# Patient Record
Sex: Female | Born: 1950 | Race: White | Hispanic: No | Marital: Married | Smoking: Never smoker
Health system: Southern US, Community
[De-identification: ages and names within clinical notes are randomized; demographics above are authoritative.]

## PROBLEM LIST (undated history)

## (undated) DIAGNOSIS — Z8679 Personal history of other diseases of the circulatory system: Secondary | ICD-10-CM

## (undated) DIAGNOSIS — C9000 Multiple myeloma not having achieved remission: Secondary | ICD-10-CM

## (undated) DIAGNOSIS — Z9889 Other specified postprocedural states: Secondary | ICD-10-CM

## (undated) DIAGNOSIS — I2510 Atherosclerotic heart disease of native coronary artery without angina pectoris: Secondary | ICD-10-CM

## (undated) HISTORY — DX: Multiple myeloma not having achieved remission: C90.00

## (undated) HISTORY — PX: BONE MARROW TRANSPLANT: SHX200

---

## 1997-07-05 ENCOUNTER — Other Ambulatory Visit: Admission: RE | Admit: 1997-07-05 | Discharge: 1997-07-05 | Payer: Self-pay | Admitting: Family Medicine

## 1998-09-24 ENCOUNTER — Other Ambulatory Visit: Admission: RE | Admit: 1998-09-24 | Discharge: 1998-09-24 | Payer: Self-pay | Admitting: Family Medicine

## 2000-03-24 ENCOUNTER — Other Ambulatory Visit: Admission: RE | Admit: 2000-03-24 | Discharge: 2000-03-24 | Payer: Self-pay | Admitting: Family Medicine

## 2001-08-19 ENCOUNTER — Other Ambulatory Visit: Admission: RE | Admit: 2001-08-19 | Discharge: 2001-08-19 | Payer: Self-pay | Admitting: Family Medicine

## 2002-08-21 ENCOUNTER — Other Ambulatory Visit: Admission: RE | Admit: 2002-08-21 | Discharge: 2002-08-21 | Payer: Self-pay | Admitting: Family Medicine

## 2003-08-24 ENCOUNTER — Other Ambulatory Visit: Admission: RE | Admit: 2003-08-24 | Discharge: 2003-08-24 | Payer: Self-pay | Admitting: Family Medicine

## 2004-01-22 ENCOUNTER — Ambulatory Visit: Payer: Self-pay | Admitting: Family Medicine

## 2004-05-09 ENCOUNTER — Ambulatory Visit: Payer: Self-pay | Admitting: Family Medicine

## 2004-08-28 ENCOUNTER — Ambulatory Visit: Payer: Self-pay | Admitting: Family Medicine

## 2004-08-28 ENCOUNTER — Other Ambulatory Visit: Admission: RE | Admit: 2004-08-28 | Discharge: 2004-08-28 | Payer: Self-pay | Admitting: Family Medicine

## 2005-02-26 ENCOUNTER — Ambulatory Visit: Payer: Self-pay | Admitting: Family Medicine

## 2013-10-13 ENCOUNTER — Encounter (HOSPITAL_COMMUNITY): Payer: Self-pay | Admitting: Emergency Medicine

## 2013-10-13 ENCOUNTER — Emergency Department (HOSPITAL_COMMUNITY): Payer: BC Managed Care – PPO

## 2013-10-13 ENCOUNTER — Emergency Department (HOSPITAL_COMMUNITY)
Admission: EM | Admit: 2013-10-13 | Discharge: 2013-10-13 | Disposition: A | Discharge status: Home / Self Care | Payer: BC Managed Care – PPO | Attending: Emergency Medicine | Admitting: Emergency Medicine

## 2013-10-13 DIAGNOSIS — IMO0002 Reserved for concepts with insufficient information to code with codable children: Secondary | ICD-10-CM | POA: Insufficient documentation

## 2013-10-13 DIAGNOSIS — M79605 Pain in left leg: Secondary | ICD-10-CM

## 2013-10-13 DIAGNOSIS — I2510 Atherosclerotic heart disease of native coronary artery without angina pectoris: Secondary | ICD-10-CM | POA: Insufficient documentation

## 2013-10-13 DIAGNOSIS — Z9861 Coronary angioplasty status: Secondary | ICD-10-CM | POA: Insufficient documentation

## 2013-10-13 DIAGNOSIS — M79609 Pain in unspecified limb: Secondary | ICD-10-CM

## 2013-10-13 HISTORY — DX: Personal history of other diseases of the circulatory system: Z86.79

## 2013-10-13 HISTORY — DX: Atherosclerotic heart disease of native coronary artery without angina pectoris: I25.10

## 2013-10-13 HISTORY — DX: Other specified postprocedural states: Z98.890

## 2013-10-13 MED ORDER — IBUPROFEN 600 MG PO TABS: 600.0000 mg | ORAL_TABLET | Freq: Three times a day (TID) | ORAL | Status: DC | PRN

## 2013-10-13 MED ORDER — IBUPROFEN 400 MG PO TABS: 600.0000 mg | ORAL_TABLET | Freq: Once | ORAL | Status: DC

## 2013-10-13 MED ORDER — KETOROLAC TROMETHAMINE 60 MG/2ML IM SOLN
60.0000 mg | Freq: Once | INTRAMUSCULAR | Status: AC
  Administered 2013-10-13: 60 mg via INTRAMUSCULAR
  Filled 2013-10-13: qty 2

## 2013-10-13 MED ORDER — OXYCODONE-ACETAMINOPHEN 5-325 MG PO TABS
2.0000 | ORAL_TABLET | Freq: Once | ORAL | Status: AC
  Administered 2013-10-13: 2 via ORAL
  Filled 2013-10-13: qty 2

## 2013-10-13 NOTE — ED Provider Notes (Signed)
CSN: [id number]     Arrival date & time 10/13/13  1516 History   First MD Initiated Contact with Patient 10/13/13 1623     Chief Complaint  Patient presents with  . Leg Pain     HPI Patient presents to the emergency department with some worsening left thigh pain without radiation down towards her left lower leg.  She reports it is painful to ambulate on that left leg.  She states putting pressure on that left leg hurts.  She had an MRI scan of her lumbar spine done on August 1 which reportedly demonstrated lumbar foraminal stenosis and a concerning left S1 segment enhancing lesion concerning for metastatic disease versus myeloma.  Patient reports that she had a bone scan done several days ago that was reported to her as normal.  She reports ongoing discomfort at this time despite 50 mg of oxycodone.  No fevers or chills.  No history of arterial disease.  Denies bowel or bladder complaints.  No history of cancer.  No urinary complaints.   Past Medical History  Diagnosis Date  . S/P ablation operation for arrhythmia   . Coronary artery disease    History reviewed. No pertinent past surgical history. No family history on file. History  Substance Use Topics  . Smoking status: Never Smoker   . Smokeless tobacco: Not on file  . Alcohol Use: No   OB History   Grav Para Term Preterm Abortions TAB SAB Ect Mult Living                 Review of Systems  All other systems reviewed and are negative.     Allergies  Augmentin; Ciprofloxacin; Macrobid; and Sulfa antibiotics  Home Medications   Prior to Admission medications   Medication Sig Start Date End Date Taking? Authorizing Provider  aspirin 81 MG tablet Take 81 mg by mouth every morning.    Yes Historical Provider, MD  bismuth subsalicylate (PEPTO BISMOL) 262 MG/15ML suspension Take 30 mLs by mouth every 6 (six) hours as needed for indigestion.   Yes Historical Provider, MD  Cholecalciferol 1000 UNITS tablet Take 1,000 Units by  mouth every morning.    Yes Historical Provider, MD  diazepam (VALIUM) 5 MG tablet Take 5 mg by mouth every 6 (six) hours as needed for anxiety.   Yes Historical Provider, MD  Fenofibrate (LIPOFEN) 150 MG CAPS Take 150 mg by mouth every morning.    Yes Historical Provider, MD  finasteride (PROSCAR) 5 MG tablet Take 2.5 mg by mouth every morning.    Yes Historical Provider, MD  ibuprofen (ADVIL,MOTRIN) 200 MG tablet Take 400 mg by mouth every 6 (six) hours as needed for moderate pain.   Yes Historical Provider, MD  oxyCODONE (ROXICODONE) 15 MG immediate release tablet Take 15 mg by mouth every 6 (six) hours as needed for pain.   Yes Historical Provider, MD  pravastatin (PRAVACHOL) 80 MG tablet Take 80 mg by mouth at bedtime.    Yes Historical Provider, MD  predniSONE (DELTASONE) 20 MG tablet Take 40 mg by mouth daily with breakfast.   Yes Historical Provider, MD  verapamil (VERELAN PM) 120 MG 24 hr capsule Take 120 mg by mouth every morning.    Yes Historical Provider, MD  ibuprofen (ADVIL,MOTRIN) 600 MG tablet Take 1 tablet (600 mg total) by mouth every 8 (eight) hours as needed. 10/13/13   Hoy Morn, MD   BP 113/66  Pulse 60  Temp(Src) 97.5 F (36.4 C) (  Oral)  Resp 12  SpO2 96% Physical Exam  Nursing note and vitals reviewed. Constitutional: She is oriented to person, place, and time. She appears well-developed and well-nourished. No distress.  HENT:  Head: Normocephalic and atraumatic.  Eyes: EOM are normal.  Neck: Normal range of motion.  Cardiovascular: Normal rate, regular rhythm and normal heart sounds.   Pulmonary/Chest: Effort normal and breath sounds normal.  Abdominal: Soft. She exhibits no distension. There is no tenderness.  Musculoskeletal: Normal range of motion.  Full range of motion bilateral hips knees and ankles.  No swelling or skin changes of her left lower extremity.  Dopplerable PT pulses bilaterally.  Dopplerable DP pulse on the right.  No dopplerable DP pulse on  the left.  Perfusion to her feet.  Neurological: She is alert and oriented to person, place, and time.  Skin: Skin is warm and dry.  Psychiatric: She has a normal mood and affect. Judgment normal.    ED Course  Procedures (including critical care time) Labs Review Labs Reviewed - No data to display  Imaging Review Dg Tibia/fibula Left  10/13/2013   CLINICAL DATA:  Lateral left leg pain since October 05, 2013, no bruising or swelling  EXAM: LEFT TIBIA AND FIBULA - 2 VIEW  COMPARISON:  None.  FINDINGS: There is no evidence of fracture or other focal bone lesions. Soft tissues are unremarkable.  IMPRESSION: Negative.   Electronically Signed   By: Skipper Cliche M.D.   On: 10/13/2013 18:39  I personally reviewed the imaging tests through PACS system I reviewed available ER/hospitalization records through the EMR    EKG Interpretation None      MDM   Final diagnoses:  Left leg pain    Patient's symptoms seem to be more sciatica related.  I will not pursue this S1 hyperintense lesion any further tonight.  She can followup with her primary care physician regarding this.  She feels much better after Toradol and 2 Percocets in emergency apartment.  She can ambulate without any difficulty this time.  She states significant improvement in her left lateral leg pain.  There is no dopplerable DP pulse the left foot however I feel like this is more chronic finding.  She has faint DP pulse on the right.  I do not believe this to be the presentation of an ischemic extremity and do not think that she needs acute intervention regarding this.  Her pain is all started from her left buttock and left thigh region and now is radiating down.  This is more of her presentation sciatica with resolution of her symptoms in the emergency department with pain medicine.    Hoy Morn, MD 10/13/13 [facility]

## 2013-10-13 NOTE — Progress Notes (Signed)
*  Preliminary Results* Left lower extremity venous duplex completed. Left lower extremity is negative for deep vein thrombosis. There is no evidence of left Baker's cyst.  10/13/2013 6:12 PM  Maudry Mayhew, RVT, RDCS, RDMS

## 2013-10-13 NOTE — ED Notes (Signed)
The pt has had lt hip pain  For one week.  The pain goes from her hip  Down her entire lt leg and into her foot.  She was seen i n the ed in [place] Saturday and had a mri that   Showed a lesion in her spin e.  Monday she saw her regular doctor and she had a bone scan  Yesterday. They were called today and was told she needed to come to the ed and see a Chief of Staff.  C/o pain

## 2014-10-12 DIAGNOSIS — C9030 Solitary plasmacytoma not having achieved remission: Secondary | ICD-10-CM | POA: Insufficient documentation

## 2015-01-06 IMAGING — CR DG TIBIA/FIBULA 2V*L*
4 series · 4 of 4 positions shown · non-contrast
Comparison: None.

CLINICAL DATA: Lateral left leg pain since October 05, 2013, no
bruising or swelling

EXAM:
LEFT TIBIA AND FIBULA - 2 VIEW

[t tib/fib lat left (1 of 2)]
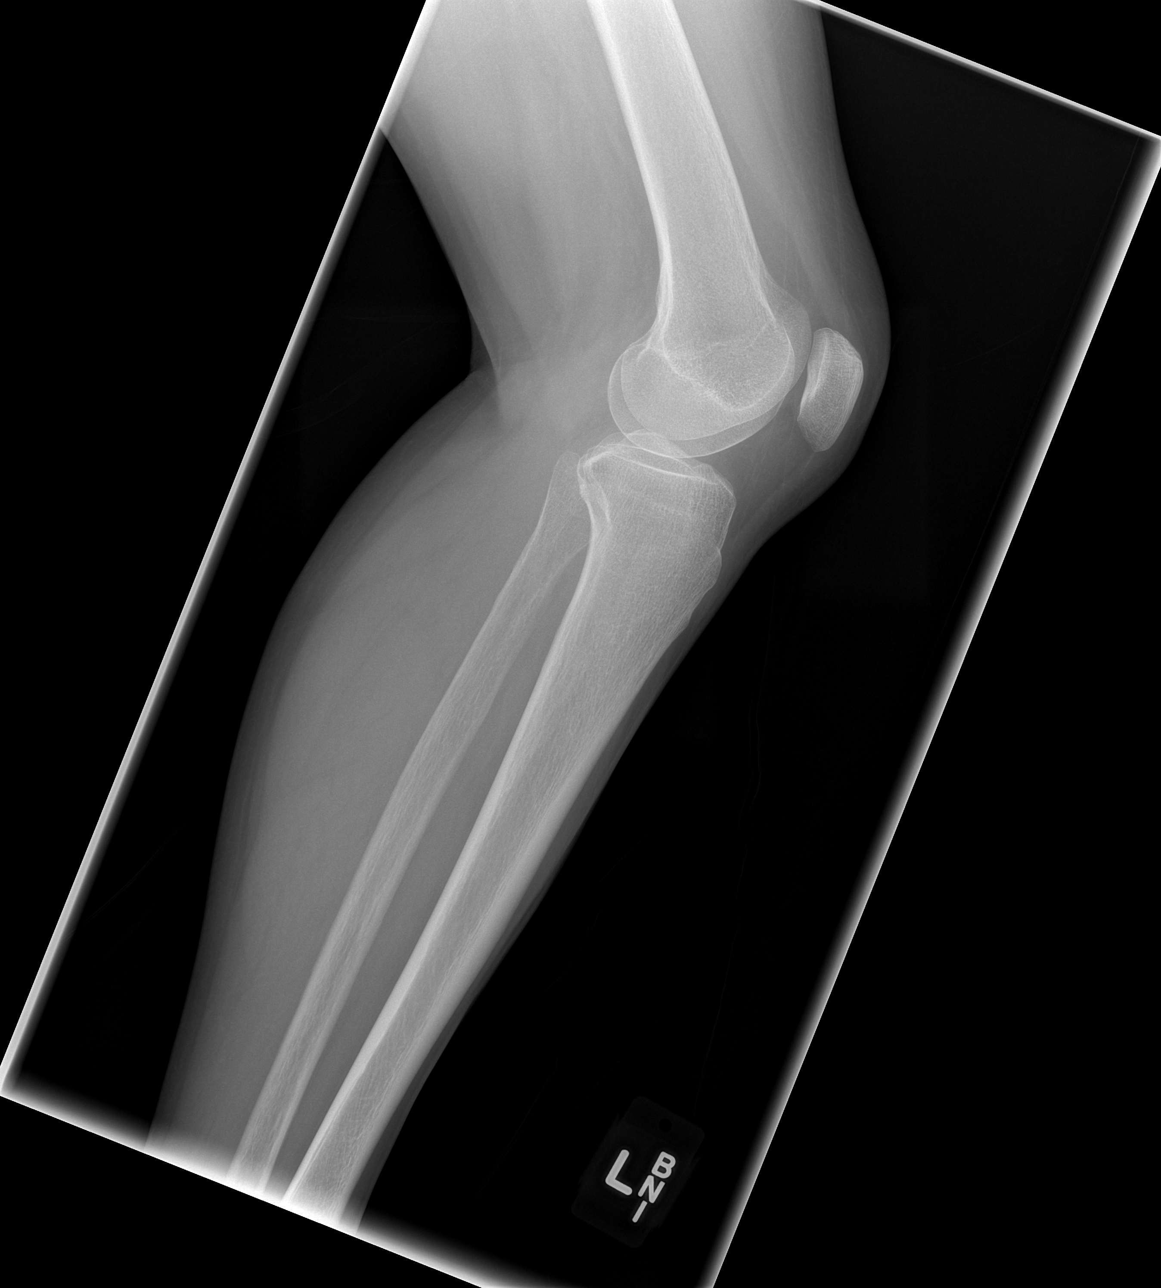

[t tib/fib lat left (2 of 2)]
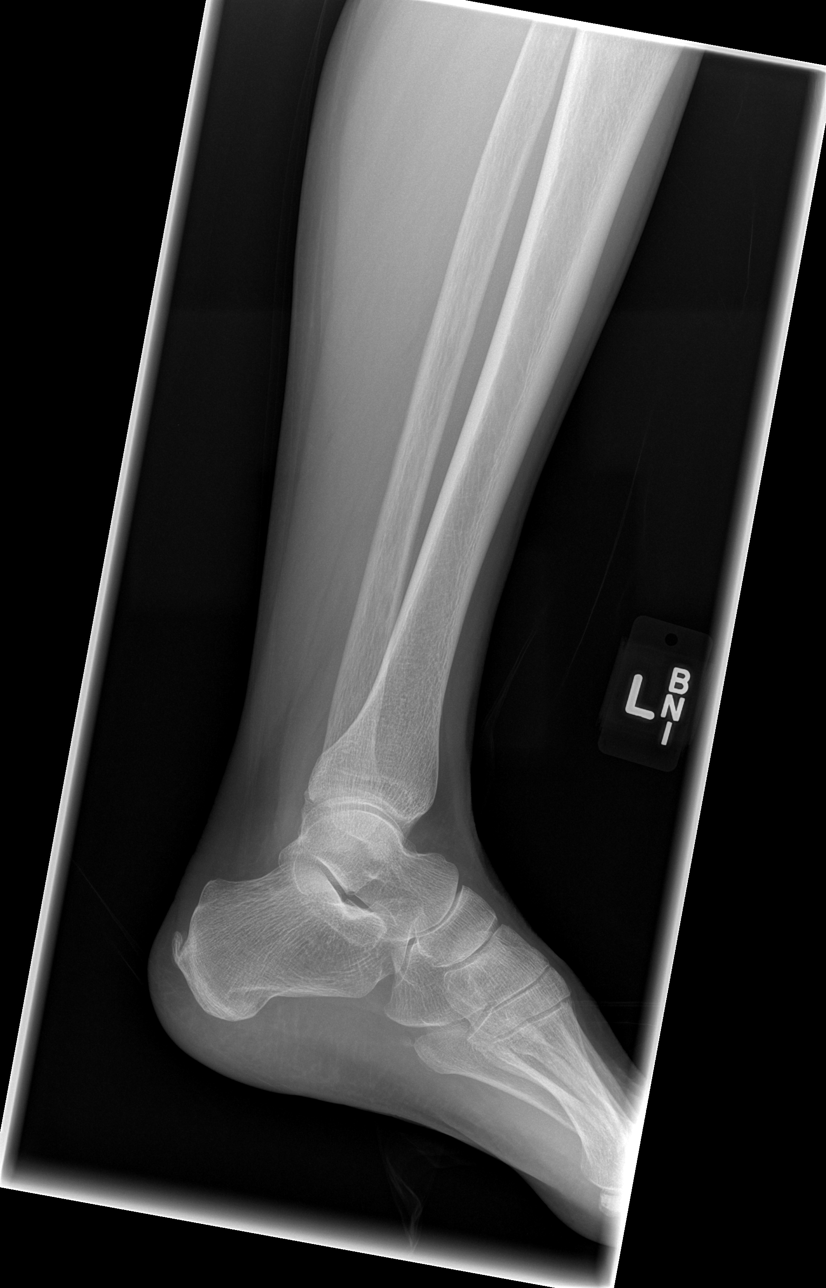

[t tib/fib ap left (1 of 2)]
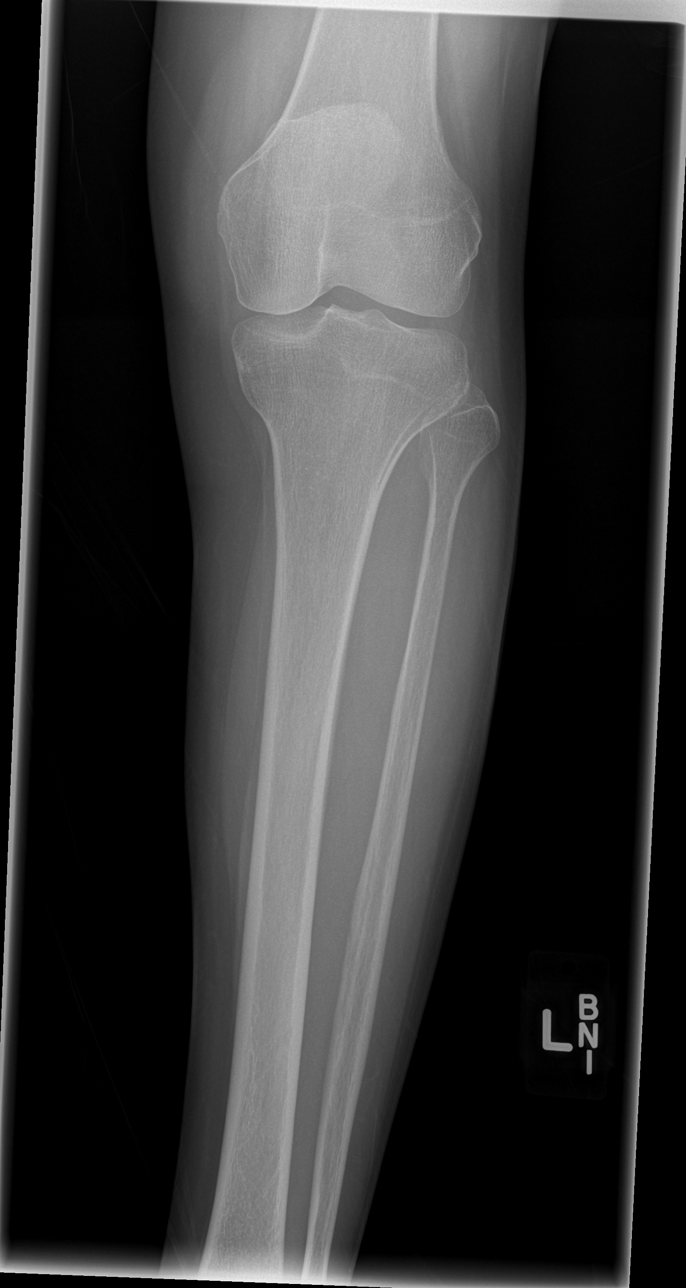

[t tib/fib ap left (2 of 2)]
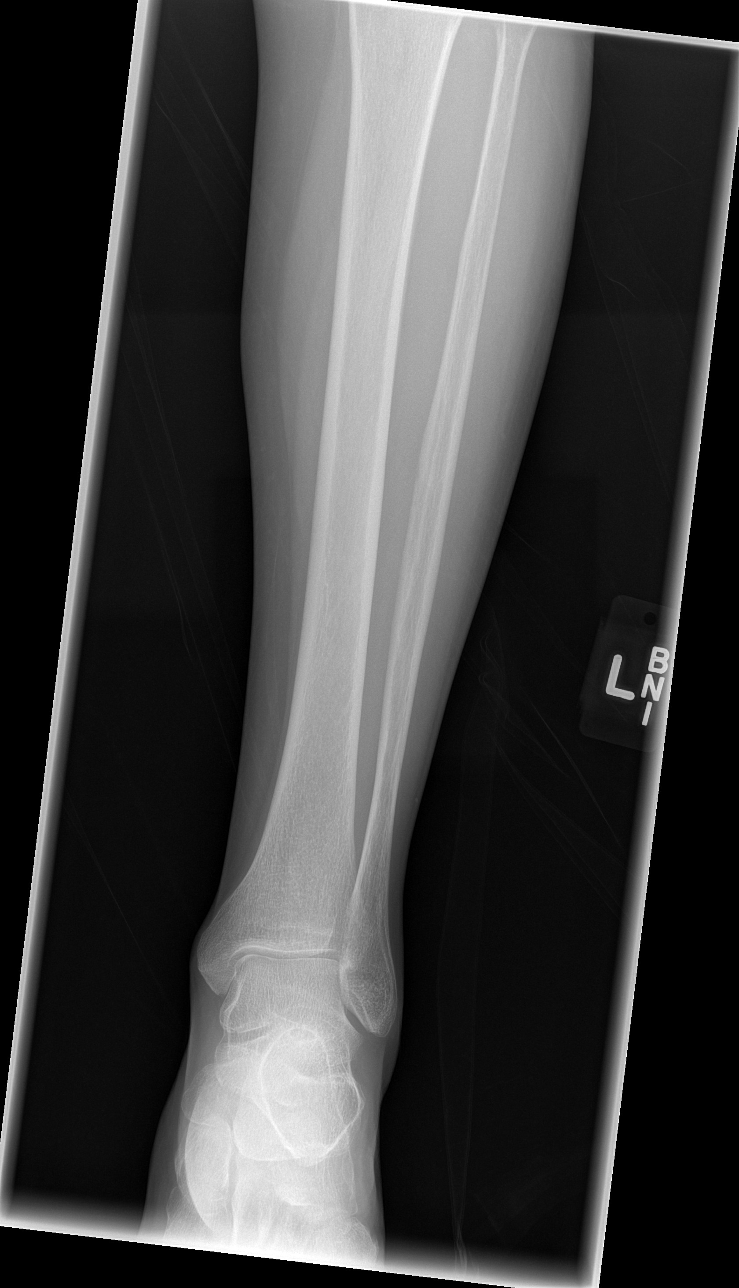

[4 of 4 positions shown; findings below may reference images not displayed]

FINDINGS: There is no evidence of fracture or other focal bone lesions. Soft
tissues are unremarkable.
IMPRESSION: Negative.

## 2015-05-01 DIAGNOSIS — C9001 Multiple myeloma in remission: Secondary | ICD-10-CM | POA: Insufficient documentation

## 2015-05-13 DIAGNOSIS — R9431 Abnormal electrocardiogram [ECG] [EKG]: Secondary | ICD-10-CM | POA: Insufficient documentation

## 2015-07-01 DIAGNOSIS — R7303 Prediabetes: Secondary | ICD-10-CM | POA: Insufficient documentation

## 2015-07-01 DIAGNOSIS — I471 Supraventricular tachycardia: Secondary | ICD-10-CM | POA: Insufficient documentation

## 2015-07-01 DIAGNOSIS — E782 Mixed hyperlipidemia: Secondary | ICD-10-CM | POA: Insufficient documentation

## 2015-07-01 DIAGNOSIS — Z131 Encounter for screening for diabetes mellitus: Secondary | ICD-10-CM | POA: Insufficient documentation

## 2015-07-01 DIAGNOSIS — F3340 Major depressive disorder, recurrent, in remission, unspecified: Secondary | ICD-10-CM | POA: Insufficient documentation

## 2015-07-23 DIAGNOSIS — F419 Anxiety disorder, unspecified: Secondary | ICD-10-CM | POA: Insufficient documentation

## 2015-07-23 DIAGNOSIS — M8580 Other specified disorders of bone density and structure, unspecified site: Secondary | ICD-10-CM | POA: Insufficient documentation

## 2015-09-17 DIAGNOSIS — Z9484 Stem cells transplant status: Secondary | ICD-10-CM | POA: Insufficient documentation

## 2015-11-13 DIAGNOSIS — T451X5A Adverse effect of antineoplastic and immunosuppressive drugs, initial encounter: Secondary | ICD-10-CM | POA: Insufficient documentation

## 2016-05-07 DIAGNOSIS — R928 Other abnormal and inconclusive findings on diagnostic imaging of breast: Secondary | ICD-10-CM | POA: Insufficient documentation

## 2017-01-22 DIAGNOSIS — M549 Dorsalgia, unspecified: Secondary | ICD-10-CM | POA: Insufficient documentation

## 2017-01-22 DIAGNOSIS — G8929 Other chronic pain: Secondary | ICD-10-CM | POA: Insufficient documentation

## 2017-01-22 DIAGNOSIS — Z8619 Personal history of other infectious and parasitic diseases: Secondary | ICD-10-CM | POA: Insufficient documentation

## 2017-10-06 DIAGNOSIS — K921 Melena: Secondary | ICD-10-CM | POA: Insufficient documentation

## 2018-03-24 DIAGNOSIS — H9193 Unspecified hearing loss, bilateral: Secondary | ICD-10-CM | POA: Insufficient documentation

## 2018-04-22 DIAGNOSIS — K922 Gastrointestinal hemorrhage, unspecified: Secondary | ICD-10-CM | POA: Insufficient documentation

## 2018-08-26 DIAGNOSIS — G4709 Other insomnia: Secondary | ICD-10-CM | POA: Insufficient documentation

## 2018-11-21 DIAGNOSIS — N390 Urinary tract infection, site not specified: Secondary | ICD-10-CM | POA: Insufficient documentation

## 2018-12-07 ENCOUNTER — Other Ambulatory Visit: Payer: Self-pay | Admitting: Sports Medicine

## 2018-12-07 ENCOUNTER — Encounter: Payer: Self-pay | Admitting: Sports Medicine

## 2018-12-07 ENCOUNTER — Ambulatory Visit (INDEPENDENT_AMBULATORY_CARE_PROVIDER_SITE_OTHER): Payer: Medicare Other

## 2018-12-07 ENCOUNTER — Other Ambulatory Visit: Payer: Self-pay

## 2018-12-07 ENCOUNTER — Ambulatory Visit (INDEPENDENT_AMBULATORY_CARE_PROVIDER_SITE_OTHER): Payer: Medicare Other | Admitting: Sports Medicine

## 2018-12-07 DIAGNOSIS — M79672 Pain in left foot: Secondary | ICD-10-CM | POA: Diagnosis not present

## 2018-12-07 DIAGNOSIS — M79671 Pain in right foot: Secondary | ICD-10-CM

## 2018-12-07 DIAGNOSIS — M79673 Pain in unspecified foot: Secondary | ICD-10-CM

## 2018-12-07 DIAGNOSIS — M722 Plantar fascial fibromatosis: Secondary | ICD-10-CM | POA: Diagnosis not present

## 2018-12-07 DIAGNOSIS — G620 Drug-induced polyneuropathy: Secondary | ICD-10-CM | POA: Diagnosis not present

## 2018-12-07 DIAGNOSIS — T451X5A Adverse effect of antineoplastic and immunosuppressive drugs, initial encounter: Secondary | ICD-10-CM

## 2018-12-07 NOTE — Patient Instructions (Signed)

## 2018-12-07 NOTE — Progress Notes (Signed)
Subjective: Heather Meyers is a 68 y.o. female patient presents to office with complaint of moderate heel pain on the left = right. Patient admits to post static dyskinesia for 2 months reports that pain is 8 out of 10 achy in nature worse with walking or when she goes from sitting to standing reports that the pain is so bad sometimes that she hobbles first thing in the morning.  Patient has not tried any specific treatment but did try changing her shoes and does notice a little bit of a difference when she has on shoes versus walking barefoot.  Patient denies any change in activity or any acute injury or trauma.  Patient is and chemo and is in remission with history of multiple myeloma.  Patient admits to history of tingling to toes since starting chemo and knows that she has neuropathy currently on Lyrica.  Denies any other pedal complaints.   There are no active problems to display for this patient.   Current Outpatient Medications on File Prior to Visit  Medication Sig Dispense Refill  . ondansetron (ZOFRAN-ODT) 8 MG disintegrating tablet as needed.    Marland Kitchen aspirin 81 MG tablet Take 81 mg by mouth every morning.      No current facility-administered medications on file prior to visit.     Allergies  Allergen Reactions  . Cefdinir Other (See Comments)    Other reaction(s): Bleeding (intolerance) Hemorrhoid bleeding Hemorrhoid bleeding   . Cephalexin Hives and Other (See Comments)    Has taken without problem   . Augmentin [Amoxicillin-Pot Clavulanate] Nausea Only  . Ciprofloxacin Nausea Only  . Macrobid [Nitrofurantoin Monohyd Macro] Nausea Only  . Sulfa Antibiotics Hives    Objective: Physical Exam General: The patient is alert and oriented x3 in no acute distress.  Dermatology: Skin is warm, dry and supple bilateral lower extremities. Nails 1-10 are normal. There is no erythema, edema, no eccymosis, no open lesions present. Integument is otherwise unremarkable.  Vascular:  Dorsalis Pedis pulse and Posterior Tibial pulse are 1/4 bilateral. Capillary fill time is immediate to all digits.  Neurological: Grossly intact to light touch with an achilles reflex of +2/5 and a  negative Tinel's sign bilateral.  Musculoskeletal: Tenderness to palpation at the medial calcaneal tubercale and through the insertion of the plantar fascia on the left=right foot. No pain with compression of calcaneus bilateral. No pain with tuning fork to calcaneus bilateral. No pain with calf compression bilateral. There is decreased Ankle joint range of motion bilateral. All other joints range of motion within normal limits bilateral. Strength 5/5 in all groups bilateral.   Gait: Unassisted, Antalgic avoid weight on heels  Xray, Right/Left foot:  Normal osseous mineralization. Joint spaces preserved. No fracture/dislocation/boney destruction. Calcaneal spur present with mild thickening of plantar fascia. No other soft tissue abnormalities or radiopaque foreign bodies.   Assessment and Plan: Problem List Items Addressed This Visit    None    Visit Diagnoses    Plantar fasciitis, bilateral    -  Primary   Inflammatory heel pain, unspecified laterality       Chemotherapy-induced neuropathy ([place])           -Complete examination performed.  -Xrays reviewed -Discussed with patient in detail the condition of plantar fasciitis, how this occurs and general treatment options. Explained both conservative and surgical treatments.  -Did not start patient on oral prednisone or give a Kenalog shot at this time patient prefers for me to speak with her oncologist Dr.  Norma Fredrickson before we consider any type of medication treatment for her inflamed heels. -Dispensed heel cushions for patient to use in shoes and advised patient to avoid from walking barefoot. -Explained and dispensed to patient daily stretching exercises. -Recommend patient to ice affected area 1-2x daily. -Patient to return to office as  scheduled or sooner if problems or questions arise.  Landis Martins, DPM

## 2018-12-29 ENCOUNTER — Telehealth: Payer: Self-pay | Admitting: Urology

## 2018-12-29 NOTE — Telephone Encounter (Signed)
Pt. Called to make appointment to see you again. Pt saw you on 9/30 for PF and said she is still having pain and wanted to discuss meds with you but pt is a cancer pt and wants you to call her Doc. At [place] would like information to here for next appointment on 01/05/2019 at 10:45.

## 2018-12-29 NOTE — Telephone Encounter (Signed)
I have called and sent note to her oncologist. We are waiting to hear back from him. -Dr. Cannon Kettle

## 2019-01-05 ENCOUNTER — Encounter: Payer: Self-pay | Admitting: Sports Medicine

## 2019-01-05 ENCOUNTER — Ambulatory Visit (INDEPENDENT_AMBULATORY_CARE_PROVIDER_SITE_OTHER): Payer: Medicare Other | Admitting: Sports Medicine

## 2019-01-05 ENCOUNTER — Other Ambulatory Visit: Payer: Self-pay

## 2019-01-05 DIAGNOSIS — T451X5A Adverse effect of antineoplastic and immunosuppressive drugs, initial encounter: Secondary | ICD-10-CM

## 2019-01-05 DIAGNOSIS — M722 Plantar fascial fibromatosis: Secondary | ICD-10-CM

## 2019-01-05 DIAGNOSIS — G620 Drug-induced polyneuropathy: Secondary | ICD-10-CM

## 2019-01-05 DIAGNOSIS — M79673 Pain in unspecified foot: Secondary | ICD-10-CM

## 2019-01-05 MED ORDER — TRIAMCINOLONE ACETONIDE 10 MG/ML IJ SUSP
10.0000 mg | Freq: Once | INTRAMUSCULAR | Status: AC
  Administered 2019-01-05: 10 mg

## 2019-01-05 NOTE — Progress Notes (Signed)
Subjective: Heather Meyers is a 68 y.o. female patient presents to office with complaint of moderate heel pain on the left = right. Patient reports that the pain is about the same worse with major activities and first few steps out of bed reports that she likes wearing the heel cushions but still has pain only time that she has had relief is when she goes for chemo and they give her dexamethasone that seems to help for about 2 days and then the pain flares back up.  Patient denies any other pedal complaints.   There are no active problems to display for this patient.   Current Outpatient Medications on File Prior to Visit  Medication Sig Dispense Refill  . acyclovir (ZOVIRAX) 800 MG tablet Take 800 mg by mouth 2 (two) times daily.    Marland Kitchen aspirin 81 MG tablet Take 81 mg by mouth every morning.     Marland Kitchen atorvastatin (LIPITOR) 80 MG tablet TAKE ONE TABLET BY MOUTH ONCE DAILY FOR cholesterol    . glycopyrrolate (ROBINUL) 1 MG tablet     . omeprazole (PRILOSEC) 20 MG capsule Take 20 mg by mouth daily.    . ondansetron (ZOFRAN-ODT) 8 MG disintegrating tablet as needed.    . pregabalin (LYRICA) 50 MG capsule Take 50 mg by mouth 2 (two) times daily.    Marland Kitchen REVLIMID 5 MG capsule     . sertraline (ZOLOFT) 100 MG tablet TAKE ONE TABLET BY MOUTH ONCE DAILY FOR ANXIETY     No current facility-administered medications on file prior to visit.     Allergies  Allergen Reactions  . Cefdinir Other (See Comments)    Other reaction(s): Bleeding (intolerance) Hemorrhoid bleeding Hemorrhoid bleeding   . Cephalexin Hives and Other (See Comments)    Has taken without problem   . Augmentin [Amoxicillin-Pot Clavulanate] Nausea Only  . Ciprofloxacin Nausea Only  . Macrobid [Nitrofurantoin Monohyd Macro] Nausea Only  . Sulfa Antibiotics Hives    Objective: Physical Exam General: The patient is alert and oriented x3 in no acute distress.  Dermatology: Skin is warm, dry and supple bilateral lower extremities.  Nails 1-10 are normal. There is no erythema, edema, no eccymosis, no open lesions present. Integument is otherwise unremarkable.  Vascular: Dorsalis Pedis pulse and Posterior Tibial pulse are 1/4 bilateral. Capillary fill time is immediate to all digits.  Neurological: Grossly intact to light touch with an achilles reflex of +2/5 and a  negative Tinel's sign bilateral.  Musculoskeletal: Tenderness to palpation at the medial calcaneal tubercale and through the insertion of the plantar fascia on the left=right foot. No pain with compression of calcaneus bilateral. No pain with tuning fork to calcaneus bilateral. No pain with calf compression bilateral. There is decreased Ankle joint range of motion bilateral. All other joints range of motion within normal limits bilateral. Strength 5/5 in all groups bilateral.   Assessment and Plan: Problem List Items Addressed This Visit    None    Visit Diagnoses    Plantar fasciitis, bilateral    -  Primary   Inflammatory heel pain, unspecified laterality       Chemotherapy-induced neuropathy ([facility])         -Complete examination performed.  -Previous xrays reviewed -Re-Discussed with patient in detail the condition of plantar fasciitis, how this occurs and general treatment options. Explained both conservative and surgical treatments.  -After oral consent and aseptic prep, injected a mixture containing 1 ml of 2%  plain lidocaine, 1 ml 0.5% plain  marcaine, 0.5 ml of kenalog 10 and 0.5 ml of dexamethasone phosphate into into right and left heel at glabrous junction without complication. Post-injection care discussed with patient.  -Dispensed a new heel cushions for patient to use in shoes and advised patient to avoid from walking barefoot and may invest in getting over-the-counter silicone heel cushions as well. -Advised patient to continue with daily stretching exercises and advised icing -Patient to return to office as scheduled in 1 month or sooner if  problems or questions arise.  Landis Martins, DPM

## 2019-02-09 ENCOUNTER — Ambulatory Visit: Payer: Medicare Other | Admitting: Sports Medicine

## 2019-02-24 ENCOUNTER — Encounter: Payer: Self-pay | Admitting: Sports Medicine

## 2019-02-24 ENCOUNTER — Other Ambulatory Visit: Payer: Self-pay

## 2019-02-24 ENCOUNTER — Ambulatory Visit (INDEPENDENT_AMBULATORY_CARE_PROVIDER_SITE_OTHER): Payer: Medicare Other | Admitting: Sports Medicine

## 2019-02-24 DIAGNOSIS — M79673 Pain in unspecified foot: Secondary | ICD-10-CM | POA: Diagnosis not present

## 2019-02-24 DIAGNOSIS — G620 Drug-induced polyneuropathy: Secondary | ICD-10-CM

## 2019-02-24 DIAGNOSIS — M722 Plantar fascial fibromatosis: Secondary | ICD-10-CM | POA: Diagnosis not present

## 2019-02-24 DIAGNOSIS — T451X5A Adverse effect of antineoplastic and immunosuppressive drugs, initial encounter: Secondary | ICD-10-CM | POA: Diagnosis not present

## 2019-02-24 NOTE — Progress Notes (Signed)
Subjective: Heather Meyers is a 68 y.o. female returns to office for follow up evaluation after Left/Right heel injection for plantar fasciitis, injection #1 administered 8 weeks ago. Patient states that the injection seems to help her pain reports that she is doing very well with no reoccurrence in symptoms nothing hurts nothing swells and reports that her heels are doing well it is all better.  Patient denies any recent changes in medications or new problems since last visit.   There are no problems to display for this patient.   Current Outpatient Medications on File Prior to Visit  Medication Sig Dispense Refill  . baclofen (LIORESAL) 10 MG tablet Take by mouth.    . Cholecalciferol 25 MCG (1000 UT) tablet Take by mouth.    . cholestyramine (QUESTRAN) 4 g packet Take by mouth.    Marland Kitchen acyclovir (ZOVIRAX) 800 MG tablet Take 800 mg by mouth 2 (two) times daily.    Marland Kitchen aspirin 81 MG tablet Take 81 mg by mouth every morning.     Marland Kitchen atorvastatin (LIPITOR) 80 MG tablet TAKE ONE TABLET BY MOUTH ONCE DAILY FOR cholesterol    . glycopyrrolate (ROBINUL) 1 MG tablet     . omeprazole (PRILOSEC) 20 MG capsule Take 20 mg by mouth daily.    . ondansetron (ZOFRAN-ODT) 8 MG disintegrating tablet as needed.    . pregabalin (LYRICA) 50 MG capsule Take 50 mg by mouth 2 (two) times daily.    Marland Kitchen REVLIMID 5 MG capsule     . sertraline (ZOLOFT) 100 MG tablet TAKE ONE TABLET BY MOUTH ONCE DAILY FOR ANXIETY     No current facility-administered medications on file prior to visit.    Allergies  Allergen Reactions  . Cefdinir Other (See Comments)    Other reaction(s): Bleeding (intolerance) Hemorrhoid bleeding Hemorrhoid bleeding   . Cephalexin Hives and Other (See Comments)    Has taken without problem   . Trazodone Other (See Comments)    Blurred vision, nasal congestion  . Augmentin [Amoxicillin-Pot Clavulanate] Nausea Only  . Ciprofloxacin Nausea Only  . Macrobid [Nitrofurantoin Monohyd Macro] Nausea Only   . Sulfa Antibiotics Hives    Objective:   General:  Alert and oriented x 3, in no acute distress  Dermatology: Skin is warm, dry, and supple bilateral. Nails are within normal limits. There is no lower extremity erythema, no eccymosis, no open lesions present bilateral.   Vascular: Dorsalis Pedis and Posterior Tibial pedal pulses are 1/4 bilateral. + hair growth noted bilateral. Capillary Fill Time is 3 seconds in all digits.  Mild varicosities, No edema bilateral lower extremities.   Neurological: Sensation grossly intact to light touch with an achilles reflex of +2 and a  negative Tinel's sign bilateral. Vibratory, sharp/dull, Semmes Weinstein Monofilament within normal limits.   Musculoskeletal: There is no tenderness to palpation at the medial calcaneal tubercale and through the insertion of the plantar fascia on the Left or right foot. No pain with compression to calcaneus or application of tuning fork. There is decreased Ankle joint range of motion bilateral. All other joints range of motion  within normal limits bilateral. Strength 5/5 bilateral.   Assessment and Plan: Problem List Items Addressed This Visit    None    Visit Diagnoses    Plantar fasciitis, bilateral    -  Primary   Inflammatory heel pain, unspecified laterality       Chemotherapy-induced neuropathy ([facility])       Relevant Medications   baclofen (LIORESAL)  10 MG tablet      -Complete examination performed.  -Previous x-rays reviewed. -Discussed long-term care for plantar fasciitis -Advised patient that she is doing well no reinjection is needed at this time -Continue with stretching, icing, good supportive shoes, inserts daily.  Recommend air plus insoles from [facility] or [facility]. -Discussed long term care and reocurrence; will closely monitor; if fails to improve will consider other treatment modalities.  -Patient to return to office as needed or sooner if problems or questions arise.  Landis Martins,  DPM

## 2019-05-08 DIAGNOSIS — J309 Allergic rhinitis, unspecified: Secondary | ICD-10-CM | POA: Insufficient documentation

## 2020-02-13 ENCOUNTER — Ambulatory Visit (INDEPENDENT_AMBULATORY_CARE_PROVIDER_SITE_OTHER): Payer: Medicare Other | Admitting: Sports Medicine

## 2020-02-13 ENCOUNTER — Other Ambulatory Visit: Payer: Self-pay

## 2020-02-13 ENCOUNTER — Encounter: Payer: Self-pay | Admitting: Sports Medicine

## 2020-02-13 DIAGNOSIS — T451X5A Adverse effect of antineoplastic and immunosuppressive drugs, initial encounter: Secondary | ICD-10-CM

## 2020-02-13 DIAGNOSIS — G620 Drug-induced polyneuropathy: Secondary | ICD-10-CM

## 2020-02-13 DIAGNOSIS — Z9221 Personal history of antineoplastic chemotherapy: Secondary | ICD-10-CM | POA: Insufficient documentation

## 2020-02-13 DIAGNOSIS — M722 Plantar fascial fibromatosis: Secondary | ICD-10-CM

## 2020-02-13 DIAGNOSIS — M79673 Pain in unspecified foot: Secondary | ICD-10-CM

## 2020-02-13 MED ORDER — TRIAMCINOLONE ACETONIDE 10 MG/ML IJ SUSP
10.0000 mg | Freq: Once | INTRAMUSCULAR | Status: AC
  Administered 2020-02-13: 10 mg

## 2020-02-13 NOTE — Progress Notes (Signed)
Subjective: Heather Meyers is a 69 y.o. female patient returns to office with complaint of moderate heel pain on the right greater than left. Patient reports that her pads in her shoes seem to help however wants to have another shot of medicine since that also helped in the past as well states that she has been dealing with the pain for roughly about 2 to 3 months but slowly got worse.  Patient denies any other pedal complaints.   Patient Active Problem List   Diagnosis Date Noted  . History of chemotherapy 02/13/2020  . Allergic rhinitis 05/08/2019  . Urinary tract infection without hematuria 11/21/2018  . Other insomnia 08/26/2018  . Gastrointestinal bleeding, lower 04/22/2018  . Decreased hearing of both ears 03/24/2018  . Hematochezia 10/06/2017  . Chronic back pain 01/22/2017  . H/O measles 01/22/2017  . Mammogram abnormal 05/07/2016  . 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 H/O autologous stem cell transplant ([place]) 09/17/2015  . Anxiety 07/23/2015  . Osteopenia 07/23/2015  . Screening for diabetes mellitus (DM) 07/01/2015  . Hyperlipidemia, mixed 07/01/2015  . Prediabetes 07/01/2015  . Recurrent major depressive disorder, in remission ([place]) 07/01/2015  . SVT (supraventricular tachycardia) ([place]) 07/01/2015  . Abnormal resting ECG findings 05/13/2015  . Multiple myeloma in remission ([place]) 05/01/2015  . 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 Aspirin Buf,CaCarb-MgCarb-MgO, 81 MG TABS Take by mouth.    . cyanocobalamin 1000 MCG tablet Take by mouth.    Marland Kitchen DARATUMUMAB IV     . ergocalciferol (VITAMIN D2) 1.25 MG (50000 UT) capsule Take by mouth.    . estradiol (ESTRACE) 0.1 MG/GM vaginal cream Place vaginally.    . ondansetron (ZOFRAN) 8 MG tablet Take by mouth.    Marland Kitchen acyclovir (ZOVIRAX) 800 MG tablet Take 800 mg by mouth 2 (two) times daily.    Marland Kitchen aspirin 81 MG  tablet Take 81 mg by mouth every morning.     Marland Kitchen atorvastatin (LIPITOR) 80 MG tablet TAKE ONE TABLET BY MOUTH ONCE DAILY FOR cholesterol    . baclofen (LIORESAL) 10 MG tablet Take by mouth.    . Cholecalciferol 25 MCG (1000 UT) tablet Take by mouth.    . cholestyramine (QUESTRAN) 4 g packet Take by mouth.    . doxycycline (VIBRAMYCIN) 100 MG capsule Take 100 mg by mouth 2 (two) times daily.    . fluticasone (FLONASE) 50 MCG/ACT nasal spray Place into the nose.    Marland Kitchen gentamicin (GARAMYCIN) 0.3 % ophthalmic solution Place 2 drops into the left eye 4 (four) times daily.    Marland Kitchen glycopyrrolate (ROBINUL) 1 MG tablet     . loratadine (CLARITIN) 10 MG tablet Take by mouth.    . nitrofurantoin (MACRODANTIN) 50 MG capsule Take by mouth.    Marland Kitchen omeprazole (PRILOSEC) 20 MG capsule Take 20 mg by mouth daily.    . ondansetron (ZOFRAN-ODT) 8 MG disintegrating tablet as needed.    . pregabalin (LYRICA) 50 MG capsule Take 50 mg by mouth 2 (two) times daily.    Marland Kitchen REVLIMID 5 MG capsule     . sertraline (ZOLOFT) 100 MG tablet TAKE ONE TABLET BY MOUTH ONCE DAILY FOR ANXIETY    . triamcinolone (KENALOG) 0.1 % SMARTSIG:1 Application Topical 2-3 Times Daily     No current facility-administered medications on file prior to visit.    Allergies  Allergen Reactions  . Cefdinir Other (  See Comments)    Other reaction(s): Bleeding (intolerance) Hemorrhoid bleeding Hemorrhoid bleeding   . Cephalexin Hives and Other (See Comments)    Has taken without problem   . Trazodone Other (See Comments)    Blurred vision, nasal congestion  . Augmentin [Amoxicillin-Pot Clavulanate] Nausea Only  . Ciprofloxacin Nausea Only  . Macrobid [Nitrofurantoin Monohyd Macro] Nausea Only  . Sulfa Antibiotics Hives    Objective: Physical Exam General: The patient is alert and oriented x3 in no acute distress.  Dermatology: Skin is warm, dry and supple bilateral lower extremities. Nails 1-10 are normal. There is no erythema, edema, no  eccymosis, no open lesions present. Integument is otherwise unremarkable.  Vascular: Dorsalis Pedis pulse and Posterior Tibial pulse are 1/4 bilateral. Capillary fill time is immediate to all digits.  Neurological: Grossly intact to light touch with an achilles reflex of +2/5 and a  negative Tinel's sign bilateral.  Musculoskeletal: Tenderness to palpation at the medial calcaneal tubercale and through the insertion of the plantar fascia on the right greater than left foot. No pain with compression of calcaneus bilateral. No pain with tuning fork to calcaneus bilateral. No pain with calf compression bilateral. There is decreased Ankle joint range of motion bilateral. All other joints range of motion within normal limits bilateral. Strength 5/5 in all groups bilateral.   Assessment and Plan: Problem List Items Addressed This Visit    None    Visit Diagnoses    Plantar fasciitis, bilateral    -  Primary   Inflammatory heel pain, unspecified laterality       Chemotherapy-induced neuropathy (HCC)         -Complete examination performed.  -Previous xrays reviewed -Re-Discussed with patient in detail the condition of plantar fasciitis, how this occurs and general treatment options. Explained both conservative and surgical treatments.  -After oral consent and aseptic prep, injected a mixture containing 1 ml of 2%  plain lidocaine, 1 ml 0.5% plain marcaine, 0.5 ml of kenalog 10 and 0.5 ml of dexamethasone phosphate into into right and left heel at glabrous junction without complication. Post-injection care discussed with patient.  -Patient became faint during this injection but did not pass out blood pressure was recorded and patient was thoroughly hydrated with water vital signs were noted to be stable patient was instructed if symptoms worsen to call office or to report to PCP or urgent care -Advised patient to continue with daily stretching exercises and advised icing -Patient to return to office  as needed or sooner if problems or questions arise.  Landis Martins, DPM

## 2020-05-21 ENCOUNTER — Ambulatory Visit: Payer: Medicare Other | Admitting: Sports Medicine

## 2020-05-22 ENCOUNTER — Other Ambulatory Visit: Payer: Self-pay

## 2020-05-22 ENCOUNTER — Encounter: Payer: Self-pay | Admitting: Sports Medicine

## 2020-05-22 ENCOUNTER — Ambulatory Visit (INDEPENDENT_AMBULATORY_CARE_PROVIDER_SITE_OTHER): Payer: Medicare Other | Admitting: Sports Medicine

## 2020-05-22 DIAGNOSIS — M722 Plantar fascial fibromatosis: Secondary | ICD-10-CM | POA: Diagnosis not present

## 2020-05-22 DIAGNOSIS — M79673 Pain in unspecified foot: Secondary | ICD-10-CM | POA: Diagnosis not present

## 2020-05-22 DIAGNOSIS — M216X9 Other acquired deformities of unspecified foot: Secondary | ICD-10-CM

## 2020-05-22 DIAGNOSIS — G620 Drug-induced polyneuropathy: Secondary | ICD-10-CM

## 2020-05-22 DIAGNOSIS — L603 Nail dystrophy: Secondary | ICD-10-CM

## 2020-05-22 DIAGNOSIS — T451X5A Adverse effect of antineoplastic and immunosuppressive drugs, initial encounter: Secondary | ICD-10-CM

## 2020-05-22 MED ORDER — TRIAMCINOLONE ACETONIDE 10 MG/ML IJ SUSP
10.0000 mg | Freq: Once | INTRAMUSCULAR | Status: AC
  Administered 2020-05-22: 10 mg

## 2020-05-22 MED ORDER — DICLOFENAC SODIUM 1 % EX GEL: 4.0000 g | Freq: Four times a day (QID) | CUTANEOUS | 0 refills | Status: DC

## 2020-05-22 NOTE — Progress Notes (Signed)
Subjective: Heather Meyers is a 70 y.o. female patient returns to office with complaint of heel pain again reports it was better for a little while now it is back but on the outside of the heel 9 out of 10 sharp pain.  Patient also reports that she has noticed that her right great toenail has become more discolored over the last 6 months denies pain drainage or significant issues with the nail besides the discoloration.  Patient has not tried any treatment.  No other pedal complaints noted.  Patient Active Problem List   Diagnosis Date Noted  . History of chemotherapy 02/13/2020  . Allergic rhinitis 05/08/2019  . Urinary tract infection without hematuria 11/21/2018  . Other insomnia 08/26/2018  . Gastrointestinal bleeding, lower 04/22/2018  . Decreased hearing of both ears 03/24/2018  . Hematochezia 10/06/2017  . Chronic back pain 01/22/2017  . H/O measles 01/22/2017  . Mammogram abnormal 05/07/2016  . 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 H/O autologous stem cell transplant ([place]) 09/17/2015  . Anxiety 07/23/2015  . Osteopenia 07/23/2015  . Screening for diabetes mellitus (DM) 07/01/2015  . Hyperlipidemia, mixed 07/01/2015  . Prediabetes 07/01/2015  . Recurrent major depressive disorder, in remission ([place]) 07/01/2015  . SVT (supraventricular tachycardia) ([place]) 07/01/2015  . Abnormal resting ECG findings 05/13/2015  . Multiple myeloma in remission ([place]) 05/01/2015  . 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 dicyclomine (BENTYL) 10 MG capsule Take by mouth.    . mirtazapine (REMERON) 30 MG tablet Take by mouth.    Marland Kitchen acyclovir (ZOVIRAX) 800 MG tablet Take 800 mg by mouth 2 (two) times daily.    Marland Kitchen aspirin 81 MG tablet Take 81 mg by mouth every morning.     . Aspirin Buf,CaCarb-MgCarb-MgO, 81 MG TABS Take by mouth.    Marland Kitchen atorvastatin (LIPITOR) 80 MG tablet TAKE ONE TABLET  BY MOUTH ONCE DAILY FOR cholesterol    . baclofen (LIORESAL) 10 MG tablet Take by mouth.    . Cholecalciferol 25 MCG (1000 UT) tablet Take by mouth.    . cholestyramine (QUESTRAN) 4 g packet Take by mouth.    . cyanocobalamin 1000 MCG tablet Take by mouth.    Marland Kitchen DARATUMUMAB IV     . doxycycline (VIBRAMYCIN) 100 MG capsule Take 100 mg by mouth 2 (two) times daily.    . ergocalciferol (VITAMIN D2) 1.25 MG (50000 UT) capsule Take by mouth.    . estradiol (ESTRACE) 0.1 MG/GM vaginal cream Place vaginally.    . fluticasone (FLONASE) 50 MCG/ACT nasal spray Place into the nose.    Marland Kitchen gentamicin (GARAMYCIN) 0.3 % ophthalmic solution Place 2 drops into the left eye 4 (four) times daily.    Marland Kitchen glycopyrrolate (ROBINUL) 1 MG tablet     . loratadine (CLARITIN) 10 MG tablet Take by mouth.    . nitrofurantoin (MACRODANTIN) 50 MG capsule Take by mouth.    . nortriptyline (PAMELOR) 25 MG capsule Take 25 mg by mouth at bedtime.    Marland Kitchen omeprazole (PRILOSEC) 20 MG capsule Take 20 mg by mouth daily.    . ondansetron (ZOFRAN) 8 MG tablet Take by mouth.    . ondansetron (ZOFRAN-ODT) 8 MG disintegrating tablet as needed.    . pregabalin (LYRICA) 50 MG capsule Take 50 mg by mouth 2 (two) times daily.    Marland Kitchen REVLIMID 5 MG capsule     . sertraline (ZOLOFT)  100 MG tablet TAKE ONE TABLET BY MOUTH ONCE DAILY FOR ANXIETY    . triamcinolone (KENALOG) 0.1 % SMARTSIG:1 Application Topical 2-3 Times Daily     No current facility-administered medications on file prior to visit.    Allergies  Allergen Reactions  . Cefdinir Other (See Comments)    Other reaction(s): Bleeding (intolerance) Hemorrhoid bleeding Hemorrhoid bleeding   . Cephalexin Hives and Other (See Comments)    Has taken without problem   . Trazodone Other (See Comments)    Blurred vision, nasal congestion  . Augmentin [Amoxicillin-Pot Clavulanate] Nausea Only  . Ciprofloxacin Nausea Only  . Macrobid [Nitrofurantoin Monohyd Macro] Nausea Only  . Sulfa  Antibiotics Hives    Objective: Physical Exam General: The patient is alert and oriented x3 in no acute distress.  Dermatology: Skin is warm, dry and supple bilateral lower extremities. Nails 1-10 are normal except right great toenail where there is lifting of the nail and discoloration of the nail bed. There is no erythema, edema, no eccymosis, no open lesions present. Integument is otherwise unremarkable.  Vascular: Dorsalis Pedis pulse and Posterior Tibial pulse are 1/4 bilateral. Capillary fill time is immediate to all digits.  Neurological: Grossly intact to light touch with an achilles reflex of +2/5 and a  negative Tinel's sign bilateral.  Musculoskeletal: Tenderness to palpation at the lateral calcaneal tubercale and through the insertion of the plantar fascia on the right greater than left foot. No pain with compression of calcaneus bilateral. No pain with tuning fork to calcaneus bilateral. No pain with calf compression bilateral. There is decreased Ankle joint range of motion bilateral. All other joints range of motion within normal limits bilateral. Strength 5/5 in all groups bilateral.   Assessment and Plan: Problem List Items Addressed This Visit   None   Visit Diagnoses    Plantar fasciitis, bilateral    -  Primary   R>L lateral band   Inflammatory heel pain, unspecified laterality       Chemotherapy-induced neuropathy (HCC)       Relevant Medications   mirtazapine (REMERON) 30 MG tablet   nortriptyline (PAMELOR) 25 MG capsule   Acquired equinus deformity of foot, unspecified laterality       Nail dystrophy         -Complete examination performed.  -Re-Discussed with patient in detail the condition of plantar fasciitis now lateral band greatest on the right, how this occurs and general treatment options. Explained both conservative and surgical treatments.  -After oral consent and aseptic prep, injected a mixture containing 1 ml of 2%  plain lidocaine, 1 ml 0.5%  plain marcaine, 0.5 ml of kenalog 10 and 0.5 ml of dexamethasone phosphate into into right lateral heel at glabrous junction without complication. Post-injection care discussed with patient.  -Dispensed night splint for patient to use as instructed -Recommend patient to use topical Voltaren as needed for pain -Advised good supportive shoes with heel cushions -Advised patient to continue with daily stretching exercises and advised icing -Advised patient to use topical tea tree oil to right great toenail and avoid aggressively trimming or digging underneath the nail that can cause it to lift more -Patient to return to office if fails to continue to improve after 1 month or sooner if problems or questions arise.  Landis Martins, DPM

## 2020-05-29 ENCOUNTER — Telehealth: Payer: Self-pay

## 2020-05-29 NOTE — Telephone Encounter (Signed)
Voltaren was recommended at last visit.

## 2020-05-29 NOTE — Telephone Encounter (Signed)
Pt called and LVM stating she had received a txt message from her pharmacy stating that she has a Rx request for Diclofenac gel. Pt stated she was not aware that the gel was Rx'ed by Dr. Cannon Kettle.  At the [facility] notes it states that Voltaren gel was recommended for the patient, please advise for any other instructions

## 2020-05-30 NOTE — Telephone Encounter (Signed)
Called pt back and LVM stating that per Dr. Cannon Kettle voltaren was recommended at last visit and she did sent a Rx since it's cheaper than OTC

## 2021-04-15 ENCOUNTER — Ambulatory Visit (INDEPENDENT_AMBULATORY_CARE_PROVIDER_SITE_OTHER): Payer: Medicare Other | Admitting: Allergy

## 2021-04-15 ENCOUNTER — Other Ambulatory Visit: Payer: Self-pay

## 2021-04-15 ENCOUNTER — Encounter: Payer: Self-pay | Admitting: Allergy

## 2021-04-15 VITALS — BP 124/82 | HR 89 | Resp 16 | Ht 62.0 in | Wt 136.6 lb

## 2021-04-15 DIAGNOSIS — L299 Pruritus, unspecified: Secondary | ICD-10-CM | POA: Diagnosis not present

## 2021-04-15 DIAGNOSIS — L298 Other pruritus: Secondary | ICD-10-CM | POA: Diagnosis not present

## 2021-04-15 NOTE — Progress Notes (Signed)
New Patient Note  RE: Heather Meyers MRN: [id number] DOB: [birth year] Date of Office Visit: 04/15/2021  Primary care provider: Algis Greenhouse, MD  Chief Complaint: itching  History of present illness: Heather Meyers is a 71 y.o. female presenting today for evaluation of pruritus.  She has history of multiple myeloma currently in remission followed by hematology.  She has been having itching that can occur in multiple areas of her body for the past couple months.  The itch for a while she states was daily.  She states the itch for mores like a sting or prickling of the skin.  It moves around and states has been on face, ear, arms, legs.  The states lately has not bother her as often.  She is not had any visible or palpable rash with the itch.  She does feel the itch gets worse later in the day.   She has been recommended by her PCP to take famotidine twice a day for about 3 days and didn't seem like it didn't helped.  She has also been prescribed hydroxyzine for as needed use for itching but also didn't help much; she may have taken this 4 times a day.   She started out taking benadryl and claritin which hasn't helped.  She has been moisturizing with Aveeno.  Denies preceding illness, no foods make itching else, no new medications, no bites/stings, no change in soaps/lotions/body products. She denies any fevers, night sweats/chills, unintentional weight changes.  Denies any recent travel.  No household members with similar symptoms. No changes in her long-standing medications.  She has had chemo and radiation for multiple myeloma.  She also reports having nasal itch that is a different type of itch than the body itch.  No history of eczema.     Review of systems: Review of Systems  Constitutional: Negative.   HENT:         Nasal itch  Eyes: Negative.   Respiratory: Negative.    Cardiovascular: Negative.   Gastrointestinal: Negative.   Musculoskeletal: Negative.   Skin:         See HPI  Allergic/Immunologic: Negative.   Neurological: Negative.    All other systems negative unless noted above in HPI  Past medical history: Past Medical History:  Diagnosis Date   Coronary artery disease    Multiple myeloma (HCC)    S/P ablation operation for arrhythmia     Past surgical history: Past Surgical History:  Procedure Laterality Date   BONE MARROW TRANSPLANT     CESAREAN SECTION      Family history:  Family History  Family history unknown: Yes    Social history: Lives in a home with carpeting in the bedroom with electric heating as well as heat pump heating and cooling.  No pets in the home.  There are cows and chickens outside the home.  There is no concern for water damage, mildew or roaches in the home.  She is retired, Agricultural engineer.  She has no smoking history.   Medication List: Current Outpatient Medications  Medication Sig Dispense Refill   aspirin 81 MG tablet Take 81 mg by mouth every morning.      atorvastatin (LIPITOR) 80 MG tablet TAKE ONE TABLET BY MOUTH ONCE DAILY FOR cholesterol     baclofen (LIORESAL) 10 MG tablet Take by mouth.     Cholecalciferol 25 MCG (1000 UT) tablet Take by mouth.     ergocalciferol (VITAMIN D2) 1.25 MG (50000 UT) capsule  Take by mouth.     estradiol (ESTRACE) 0.1 MG/GM vaginal cream Place vaginally.     omeprazole (PRILOSEC) 20 MG capsule Take 20 mg by mouth daily.     cholestyramine (QUESTRAN) 4 g packet Take by mouth.     No current facility-administered medications for this visit.    Known medication allergies: Allergies  Allergen Reactions   Cefdinir Other (See Comments)    Other reaction(s): Bleeding (intolerance) Hemorrhoid bleeding Hemorrhoid bleeding    Cephalexin Hives and Other (See Comments)    Has taken without problem    Trazodone Other (See Comments)    Blurred vision, nasal congestion   Augmentin [Amoxicillin-Pot Clavulanate] Nausea Only   Ciprofloxacin Nausea Only   Macrobid  [Nitrofurantoin Monohyd Macro] Nausea Only   Sulfa Antibiotics Hives     Physical examination: Blood pressure 124/82, pulse 89, resp. rate 16, height 5' 2"  (1.575 m), weight 136 lb 9.6 oz (62 kg), SpO2 99 %.  General: Alert, interactive, in no acute distress. HEENT: PERRLA, TMs pearly gray, turbinates non-edematous without discharge, post-pharynx non erythematous. Neck: Supple without lymphadenopathy. Lungs: Clear to auscultation without wheezing, rhonchi or rales. {no increased work of breathing. CV: Normal S1, S2 without murmurs. Abdomen: Nondistended, nontender. Skin: Warm and dry, without lesions or rashes. Extremities:  No clubbing, cyanosis or edema. Neuro:   Grossly intact.  Diagnositics/Labs: Labs: Reviewed from 03/31/21 WBC 4.4 - 11.0 x 10*3/uL 5.2   RBC 4.10 - 5.10 x 10*6/uL 4.55   Hemoglobin 12.3 - 15.3 G/DL 13.5   Hematocrit 35.9 - 44.6 % 40.8   MCV 80.0 - 96.0 FL 89.7   MCH 27.5 - 33.2 PG 29.6   MCHC 33.0 - 37.0 G/DL 33.0   RDW 12.3 - 17.0 % 14.3   Platelets 150 - 450 X 10*3/uL 207   MPV 6.8 - 10.2 FL 8.8   Neutrophil % % 61   Lymphocyte % % 28   Monocyte % % 9   Eosinophil % % 2   Basophil % % 1   Neutrophil Absolute 1.8 - 7.8 x 10*3/uL 3.2   Lymphocyte Absolute 1.0 - 4.8 x 10*3/uL 1.5   Monocyte Absolute 0.0 - 0.8 x 10*3/uL 0.5   Eosinophil Absolute 0.0 - 0.5 x 10*3/uL 0.1   Basophil Absolute 0.0 - 0.2 x 10*3/uL 0.0   nRBC <=0 x 10*3/uL 0    Sodium 135 - 146 MMOL/L 140   Potassium 3.5 - 5.3 MMOL/L 4.0   Comment: NO VISIBLE HEMOLYSIS  Chloride 98 - 110 MMOL/L 106   CO2 23 - 30 MMOL/L 30   BUN 8 - 24 MG/DL 14   Glucose 70 - 99 MG/DL 85   Comment: Patients taking eltrombopag at doses >/= 100 mg daily may show falsely elevated values of 10% or greater.  Creatinine 0.50 - 1.50 MG/DL 0.61   Calcium 8.5 - 10.5 MG/DL 9.5   Total Protein 6.0 - 8.3 G/DL 6.5   Comment: Patients taking eltrombopag at doses >/= 100 mg daily may show falsely elevated values of 10%  or greater.  Albumin  3.5 - 5.0 G/DL 4.1   Total Bilirubin 0.1 - 1.2 MG/DL 0.6   Comment: Patients taking eltrombopag at doses >/= 100 mg daily may show falsely elevated values of 10% or greater.  Alkaline Phosphatase 34 - 104 IU/L or U/L 41   AST (SGOT) 5 - 40 IU/L or U/L 19   ALT (SGPT) 5 - 50 IU/L or U/L 17   Anion  Gap 4 - 14 MMOL/L 4   Est. GFR >=60 ML/MIN/1.73 M*2 >90   Comment: GFR estimated by CKD-EPI equations(NKF 2021).    Total Protein 6.4 - 8.9 G/DL 6.3 Low    Comment: Patients taking eltrombopag at doses >/= 100 mg daily may show falsely elevated values of 10% or greater.  Alpha-1-Globulin 0.1 - 0.4 G/DL 0.2   Alpha-2-Globulin 0.4 - 1.2 G/DL 0.7   Beta-1- Globulin 0.4 - 0.7 G/DL 0.4   Beta-2- Globulin 0.1 - 0.4 G/DL 0.3   Gamma Globulin 0.5 - 1.6 G/DL 0.5   Comment SPE  NO M SPIKE SEEN.   Reviewed and Interpreted By   Jamison Neighbor, M.D.   Albumin 3.5 - 5.0 G/DL 4.2    SERUM IEOP COMMENT   POLYCLONAL.   Reviewed and Interpreted By   Jamison Neighbor, M.D.    IGG 635 - 1741 MG/DL 564 Low    IGM 45 - 281 MG/DL 30 Low    IGA 66 - 433 MG/DL 74    FREE LAMBDA 5.71 - 26.30 mg/L 6.17   KAPPA/LAMBDA RATIO 0.26 - 1.65 1.80 High     From 12/27/20:  TSH 0.45 - 5.00 UIU/ML 2.05    FREE T4 0.6 - 1.3 NG/DL 0.8     Allergy testing:   Airborne Adult Perc - 04/15/21 1418     Time Antigen Placed 1418    Allergen Manufacturer Lavella Hammock    Location Back    Number of Test 59    Panel 1 Select    1. Control-Buffer 50% Glycerol Negative    2. Control-Histamine 1 mg/ml 2+    3. Albumin saline Negative    4. Agency Negative    5. [place] Negative    6. Johnson Negative    7. Nilwood Blue Negative    8. Meadow Fescue Negative    9. Perennial Rye Negative    10. Sweet Vernal Negative    11. Timothy Negative    12. Cocklebur Negative    13. Burweed Marshelder Negative    14. Ragweed, short Negative    15. Ragweed, Giant Negative    16. Plantain,  English  Negative    17. Lamb's Quarters Negative    18. Sheep Sorrell Negative    19. Rough Pigweed Negative    20. Marsh Elder, Rough Negative    21. Mugwort, Common Negative    22. Ash mix Negative    23. Birch mix Negative    24. Beech American Negative    25. Box, Elder Negative    26. Cedar, red Negative    27. Cottonwood, [place] Negative    28. Elm mix Negative    29. Hickory Negative    30. Maple mix Negative    31. Oak, [place] mix Negative    32. Pecan Pollen Negative    33. Pine mix Negative    34. Sycamore Eastern Negative    35. Tiffin, Black Pollen Negative    36. Alternaria alternata Negative    37. Cladosporium Herbarum Negative    38. Aspergillus mix Negative    39. Penicillium mix Negative    40. Bipolaris sorokiniana (Helminthosporium) Negative    41. Drechslera spicifera (Curvularia) Negative    42. Mucor plumbeus Negative    43. Fusarium moniliforme Negative    44. Aureobasidium pullulans (pullulara) Negative    45. Rhizopus oryzae Negative    46. Botrytis cinera Negative    47. Epicoccum nigrum Negative    48.  Phoma betae Negative    49. Candida Albicans Negative    50. Trichophyton mentagrophytes Negative    51. Mite, D Farinae  5,000 AU/ml Negative    52. Mite, D Pteronyssinus  5,000 AU/ml Negative    53. Cat Hair 10,000 BAU/ml Negative    54.  Dog Epithelia Negative    55. Mixed Feathers Negative    56. Horse Epithelia Negative    57. Cockroach, German Negative    58. Mouse Negative    59. Tobacco Leaf Negative             Food Perc - 04/15/21 1418       Test Information   Time Antigen Placed 1418    Allergen Manufacturer Lavella Hammock    Location Back    Number of allergen test [place]   1. Peanut Negative    2. Soybean food Negative    3. Wheat, whole Negative    4. Sesame Negative    5. Milk, cow Negative    6. Egg White, chicken Negative    7. Casein Negative    8. Shellfish mix Negative    9. Fish mix Negative     10. Cashew Negative             Allergy testing results were read and interpreted by provider, documented by clinical staff.   Assessment and plan:   Itching (Pruritus), chronic Nasal itch -stinging/prickly-like sensation on body without visible or palpable rash -nasal itch -has had no improvement with different antihistamine medications including benadryl, claritin, hydroxyzine or famotidine.  Without any improvement less likely the itch sensation is allergenic in nature -environmental allergy testing today is negative -common food allergy testing is negative -would recommend obtaining alpha gal panel to rule-out red meat allergy -can try a trial of Eucrisa ointment to see if this helps the itch.  Georga Hacking is a non-steroid ointment that in some has helped with itch without associated rash.   -with this stinging/prickliness of rash could be a neurogenic itch in nature where nerve-endings are affected.  Can try over-the-counter Sarna lotion to see if this helps more than the Aveeno moisturizer.    Follow-up as needed pending lab results   I appreciate the opportunity to take part in Yarexi's care. Please do not hesitate to contact me with questions.  Sincerely,   Prudy Feeler, MD Allergy/Immunology [facility]

## 2021-04-15 NOTE — Patient Instructions (Addendum)
Itching (Pruritus) -stinging/prickly-like sensation on body without visible or palpable rash -nasal itch -has had no improvement with different antihistamine medications including benadryl, claritin, hydroxyzine or famotidine.  Without any improvement less likely the itch sensation is allergenic in nature -environmental allergy testing today is negative -common food allergy testing is negative -would recommend obtaining alpha gal panel to rule-out red meat allergy -can try a trial of Eucrisa ointment to see if this helps the itch.  Heather Meyers is a non-steroid ointment that in some has helped with itch without associated rash.   -with this stinging/prickliness of rash could be a neurogenic itch in nature where nerve-endings are affected.  Can try over-the-counter Sarna lotion to see if this helps more than the Aveeno moisturizer.    Follow-up as needed pending lab results

## 2021-04-21 LAB — ALPHA-GAL PANEL
Allergen Lamb IgE: <0.1 kU/L
Beef IgE: <0.1 kU/L
IgE (Immunoglobulin E), Serum: 9 IU/mL (ref 6–495)
O215-IgE Alpha-Gal: <0.1 kU/L
Pork IgE: <0.1 kU/L

## 2021-05-05 ENCOUNTER — Telehealth: Payer: Self-pay | Admitting: Allergy

## 2021-05-05 NOTE — Telephone Encounter (Signed)
Patient states the itching is still bothering her. She states its mostly on her arms but is scattered around her body. The creams have not been helping and states she is not any better from her visit with Dr. Nelva Bush.

## 2021-05-05 NOTE — Telephone Encounter (Signed)
Called and left a voicemail asking for patient to return call to discuss.  °

## 2021-05-05 NOTE — Telephone Encounter (Signed)
Called and spoke with patient and she stated that she is taking Claritin and using the [place] without any benefit. Do you have any further recommendation?

## 2021-05-06 NOTE — Telephone Encounter (Signed)
Heather Meyers informed and she will call her dermatologist and schedule an appointment.

## 2023-02-09 ENCOUNTER — Encounter: Payer: Self-pay | Admitting: Allergy

## 2023-02-09 ENCOUNTER — Ambulatory Visit (INDEPENDENT_AMBULATORY_CARE_PROVIDER_SITE_OTHER): Payer: Medicare Other | Admitting: Allergy

## 2023-02-09 VITALS — BP 122/68 | HR 80 | Resp 18

## 2023-02-09 DIAGNOSIS — K529 Noninfective gastroenteritis and colitis, unspecified: Secondary | ICD-10-CM

## 2023-02-09 NOTE — Patient Instructions (Addendum)
Chronic diarrhea - followed by gastroenterologist at [facility] with upcoming appt in Jan - previous common food allergy testing from 04/2021 was negative to milk, egg, wheat, soy, sesame, nuts, fish and shellfish foods - recommend keeping a food journal when you have diarrhea to see if anything is consistent - will obtain updated food allergy labs today via blood work and will call with these results once they all return  Follow-up pending testing

## 2023-02-09 NOTE — Progress Notes (Signed)
Follow-up Note  RE: Heather Meyers MRN: [id number] DOB: [birth year] Date of Office Visit: 02/09/2023   History of present illness: Heather Meyers is a 72 y.o. female presenting today for diarrhea.  Her initial visit with me was on 04/15/2021 for pruritus.  Discussed the use of AI scribe software for clinical note transcription with the patient, who gave verbal consent to proceed.  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  She states she was prescribed Lyrica and this stopped the itching.  She has a new concern to discuss today and that is diarrhea.  She states she has had intermittent issues with diarrhea post-transplant in 2017. The diarrhea is described as severe and episodic, with periods of normal bowel movements lasting several weeks, followed by sudden onset of symptoms. The patient has been managing the condition with colestipol, which provides some relief but also leads to alternating constipation.  She is under the care of a gastroenterologist for this.  There was some concern that maybe the diarrhea had a food allergy component and thus she presents today to discuss that.  She has not been able to identify specific food triggers for the diarrhea, but noted that symptoms often occur after eating out, particularly after consuming [place] food, specifically hibachi chicken, rice, and vegetables with white sauce. The patient also mentioned occasional issues with oatmeal and cottage cheese.  The patient has not experienced any weight changes due to the diarrhea. The patient's other symptoms and conditions, including reflux, have been managed without significant issues.  She states she will be following up with her gastroenterologist in January. Common Food allergy skin testing was performed at her initial visit and was  all negative.  Review of systems: 10pt ROS negative unless noted above in HPI  All other systems negative unless noted above in HPI  Past medical/social/surgical/family history have been reviewed and are unchanged unless specifically indicated below.  No changes  Medication List: Current Outpatient Medications  Medication Sig Dispense Refill   aspirin 81 MG tablet Take 81 mg by mouth every morning.      atorvastatin (LIPITOR) 80 MG tablet TAKE ONE TABLET BY MOUTH ONCE DAILY FOR cholesterol     Cholecalciferol 25 MCG (1000 UT) tablet Take by mouth.     colestipol (COLESTID) 1 g tablet Take 2 tablets by mouth 2 (two) times daily.     ergocalciferol (VITAMIN D2) 1.25 MG (50000 UT) capsule Take by mouth.     estradiol (ESTRACE) 0.1 MG/GM vaginal cream Place vaginally.     loratadine (CLARITIN) 10 MG tablet Take by mouth.     Multiple Vitamin (MULTI-VITAMIN) tablet Take 1 tablet by mouth daily.     omeprazole (PRILOSEC) 20 MG capsule Take 20 mg by mouth daily.     pregabalin (LYRICA) 25 MG capsule Take by mouth.     pregabalin (LYRICA) 50 MG capsule Take 50 mg by mouth 2 (two) times daily.     sertraline (ZOLOFT) 100 MG tablet Take 100 mg by mouth daily.     triamcinolone (NASACORT ALLERGY 24HR) 55 MCG/ACT AERO nasal inhaler Place 2 sprays into the nose daily.     vitamin B-12 (CYANOCOBALAMIN) 50 MCG tablet Take by mouth.     cholestyramine (QUESTRAN) 4 g packet Take by mouth.     No current facility-administered medications for  this visit.     Known medication allergies: Allergies  Allergen Reactions   Cefdinir Other (See Comments)    Other reaction(s): Bleeding (intolerance) Hemorrhoid bleeding Hemorrhoid bleeding    Trazodone Other (See Comments)    Blurred vision, nasal congestion   Augmentin [Amoxicillin-Pot Clavulanate] Nausea Only   Ciprofloxacin Nausea Only   Macrobid [Nitrofurantoin Monohyd Macro] Nausea Only   Sulfa Antibiotics Hives     Physical  examination: Blood pressure 122/68, pulse 80, resp. rate 18, SpO2 100%.  General: Alert, interactive, in no acute distress. HEENT: PERRLA, TMs pearly gray, turbinates non-edematous without discharge, post-pharynx non erythematous. Neck: Supple without lymphadenopathy. Lungs: Clear to auscultation without wheezing, rhonchi or rales. {no increased work of breathing. CV: Normal S1, S2 without murmurs. Abdomen: Nondistended, nontender. Skin: Warm and dry, without lesions or rashes. Extremities:  No clubbing, cyanosis or edema. Neuro:   Grossly intact.  Diagnositics/Labs: None today  Assessment and plan:   Chronic diarrhea - followed by gastroenterologist at [facility] with upcoming appt in Jan - previous common food allergy testing from 04/2021 was negative to milk, egg, wheat, soy, sesame, nuts, fish and shellfish foods - recommend keeping a food journal when you have diarrhea to see if anything is consistent - will obtain updated food allergy labs today via blood work and will call with these results once they all return  Follow-up pending testing  I appreciate the opportunity to take part in Heather Meyers's care. Please do not hesitate to contact me with questions.  Sincerely,   Margo Aye, MD Allergy/Immunology [facility]

## 2023-02-13 LAB — ALLERGEN PROFILE, FOOD-SPICES
Allergen Black Pepper IgE: <0.1 kU/L
Allergen Cinnamon IgE: <0.1 kU/L
Allergen Garlic IgE: <0.1 kU/L
Allergen Ginger IgE: <0.1 kU/L
Paprika IgE: <0.1 kU/L
Vanilla: <0.1 kU/L

## 2023-02-13 LAB — IGE FOOD BASIC W/COMPONENT RFX
Codfish IgE: <0.1 kU/L
F001-IgE Egg White: <0.1 kU/L
F002-IgE Milk: <0.1 kU/L
Peanut, IgE: <0.1 kU/L
Soybean IgE: <0.1 kU/L
Wheat IgE: <0.1 kU/L

## 2023-02-13 LAB — ALLERGEN, TOMATO F25: Allergen Tomato, IgE: <0.1 kU/L

## 2023-02-13 LAB — ALLERGEN, RICE, F9: Allergen Rice IgE: <0.1 kU/L

## 2023-02-13 LAB — ALLERGEN, CHICKEN F83: Chicken IgE: <0.1 kU/L

## 2023-02-13 LAB — ALLERGEN, ONION, F48: Allergen Onion IgE: <0.1 kU/L

## 2023-02-13 LAB — ALLERGEN,OAT,F7: Allergen Oat IgE: <0.1 kU/L

## 2023-03-15 ENCOUNTER — Telehealth: Payer: Self-pay | Admitting: Allergy

## 2023-03-15 NOTE — Telephone Encounter (Signed)
Patient called regarding her lab results. 

## 2023-03-16 ENCOUNTER — Ambulatory Visit: Payer: Medicare Other | Admitting: Allergy

## 2023-03-16 NOTE — Telephone Encounter (Signed)
 Pt has been informed of results

## 2023-12-25 ENCOUNTER — Ambulatory Visit (HOSPITAL_BASED_OUTPATIENT_CLINIC_OR_DEPARTMENT_OTHER): Payer: Self-pay
